# Patient Record
Sex: Female | Born: 1975 | Race: White | Hispanic: No | Smoking: Never smoker
Health system: Southern US, Community
[De-identification: ages and names within clinical notes are randomized; demographics above are authoritative.]

## PROBLEM LIST (undated history)

## (undated) DIAGNOSIS — I10 Essential (primary) hypertension: Secondary | ICD-10-CM

## (undated) DIAGNOSIS — Z9889 Other specified postprocedural states: Secondary | ICD-10-CM

## (undated) DIAGNOSIS — T8859XA Other complications of anesthesia, initial encounter: Secondary | ICD-10-CM

## (undated) DIAGNOSIS — D649 Anemia, unspecified: Secondary | ICD-10-CM

## (undated) DIAGNOSIS — R112 Nausea with vomiting, unspecified: Secondary | ICD-10-CM

## (undated) DIAGNOSIS — T4145XA Adverse effect of unspecified anesthetic, initial encounter: Secondary | ICD-10-CM

## (undated) HISTORY — PX: LUMBAR FUSION: SHX111

---

## 2011-02-11 ENCOUNTER — Encounter (HOSPITAL_COMMUNITY)
Admission: RE | Admit: 2011-02-11 | Discharge: 2011-02-11 | Disposition: A | Discharge status: Home / Self Care | Payer: Medicaid Other | Source: Ambulatory Visit | Attending: Neurosurgery | Admitting: Neurosurgery

## 2011-02-11 ENCOUNTER — Other Ambulatory Visit: Payer: Self-pay | Admitting: Neurosurgery

## 2011-02-11 ENCOUNTER — Ambulatory Visit (HOSPITAL_COMMUNITY)
Admission: RE | Admit: 2011-02-11 | Discharge: 2011-02-11 | Disposition: A | Discharge status: Home / Self Care | Payer: Medicaid Other | Source: Ambulatory Visit | Attending: Neurosurgery | Admitting: Neurosurgery

## 2011-02-11 DIAGNOSIS — Z01810 Encounter for preprocedural cardiovascular examination: Secondary | ICD-10-CM | POA: Insufficient documentation

## 2011-02-11 DIAGNOSIS — M545 Low back pain: Secondary | ICD-10-CM

## 2011-02-11 DIAGNOSIS — Z01812 Encounter for preprocedural laboratory examination: Secondary | ICD-10-CM | POA: Insufficient documentation

## 2011-02-11 DIAGNOSIS — Z01818 Encounter for other preprocedural examination: Secondary | ICD-10-CM | POA: Insufficient documentation

## 2011-02-11 LAB — HCG, SERUM, QUALITATIVE: Preg, Serum: NEGATIVE

## 2011-02-11 LAB — BASIC METABOLIC PANEL
BUN: 6 mg/dL (ref 6–23)
Calcium: 8.9 mg/dL (ref 8.4–10.5)
GFR calc non Af Amer: >60 mL/min (ref >60)
Glucose, Bld: 88 mg/dL (ref 70–99)
Sodium: 138 mEq/L (ref 135–145)

## 2011-02-11 LAB — CBC
MCV: 82.3 fL (ref 78.0–100.0)
Platelets: 234 10*3/uL (ref 150–400)
RBC: 5.04 MIL/uL (ref 3.87–5.11)
RDW: 13.5 % (ref 11.5–15.5)
WBC: 6.5 10*3/uL (ref 4.0–10.5)

## 2011-02-11 LAB — DIFFERENTIAL
Basophils Relative: 1 % (ref 0–1)
Eosinophils Absolute: 0.1 10*3/uL (ref 0.0–0.7)
Eosinophils Relative: 1 % (ref 0–5)
Lymphs Abs: 2.4 10*3/uL (ref 0.7–4.0)
Neutrophils Relative %: 55 % (ref 43–77)

## 2011-02-11 LAB — SURGICAL PCR SCREEN: Staphylococcus aureus: NEGATIVE

## 2011-02-11 LAB — TYPE AND SCREEN: ABO/RH(D): A POS

## 2011-02-15 ENCOUNTER — Inpatient Hospital Stay (HOSPITAL_COMMUNITY): Payer: Medicaid Other

## 2011-02-15 ENCOUNTER — Inpatient Hospital Stay (HOSPITAL_COMMUNITY)
Admission: RE | Admit: 2011-02-15 | Discharge: 2011-02-20 | DRG: 455 | Disposition: A | Discharge status: Home Health | Payer: Medicaid Other | Source: Ambulatory Visit | Attending: Neurosurgery | Admitting: Neurosurgery

## 2011-02-15 DIAGNOSIS — M5126 Other intervertebral disc displacement, lumbar region: Principal | ICD-10-CM | POA: Diagnosis present

## 2011-02-15 DIAGNOSIS — I10 Essential (primary) hypertension: Secondary | ICD-10-CM | POA: Diagnosis present

## 2011-02-20 NOTE — Op Note (Signed)
[name], Branch NO.:  [id number]  MEDICAL RECORD NO.:  [id number]           PATIENT TYPE:  I  LOCATION:  3001                         FACILITY:  [facility]  PHYSICIAN:  Jill Maser. Edder Bellanca, M.D.    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  PROCEDURE NOTE:  Right anterolateral retroperitoneal interbody diskectomy at L4-5 with L4-5 interbody fusion utilizing cage instrumentation.  Bone graft substitute.  A right-sided L4-5 decompressive laminotomy and foraminotomy.  Posterior L4-5 fusion with local autograft and bone graft extender.  L4-5 interspinous plating.  SURGEON:  Jill Maser. Bartow Zylstra, MDASSISTANT:  Reinaldo Meeker, MD  ANESTHESIA:  General endotracheal.  INDICATIONS:  Jill Branch is a 35 year old female with history of severe back and lower extremity pain, failing all conservative management. Workup demonstrates evidence of disk space collapse with disk herniation and facet arthropathy at L5 causing circumferential stenosis more similar on the right side.  The patient has been counseled as to her options.  She decided to proceed with L4-5 decompression and fusion after failing all conservative management.  OPERATIVE NOTE:  The patient was placed in an operative room table in a supine position.  After adequate level of anesthesia achieved, the patient was positioned in the left lateral decubitus position and appropriately padded.  The patient's right flank was prepped and draped sterilely.  Using intraoperative fluoroscopy and intraoperative nerve monitoring, incision was made overlying the right-sided L4-5 interspace. A  secondary incision was made into the right posterior flank.  Through the right posterior flank incision, tissues were split and the retroperitoneal space was entered.  A guide was then passed through the abdominal wall into the right-sided retroperitoneal space and passed down to the level of the psoas muscle.  Stimulation was then performed confirming no lingering effects of muscle relaxation and good intraoperative EMG nerve monitoring.  The guide was then passed toward the L4-5 disk space.  An appropriate docking point was made.  The guide was then distracted and a self-retaining retractor was placed over the guide after a guidewire had been inserted into the L4-5 disk space.  The retractor was confirmed in good position with intraoperative nerve monitoring and fluoroscopy.  Diskectomy was then performed at L4-5 using a various curettes and pituitary rongeurs.  Disk space was eventually sized to a 18 x 8-mm standard implant.  Implant was then packed with Actifuse and OsteoSet and then impacted into place.  Good position in both the lateral and AP plane was confirmed.  The applier was removed. Hemostasis was ensured as the retractor was withdrawn.  There is no evidence of nerve or vascular injury during this part of procedure. Both wounds were then irrigated and closed in typical fashion.  The patient was then turned prone, and appropriately padded.  The patient's lumbar region was prepped and draped sterilely.  A #10-blade was used to make a linear skin incision overlying the L4-5 interspace.  This was carried down sharply in the midline.  A subperiosteal dissection was then performed exposing the lamina and facet joints of L4 and L5.  Deep self-retaining was placed.  Intraoperative x-ray was taken, level was confirmed.  The L4-5 interspace on the right side was then approached and a decompressive laminotomy was then performed using high-speed drill and Kerrison rongeurs.  Inferior  aspect of the lamina of L4 and medial aspect of the L4-5 facet joint.  Superior rim of the L5 lamina was removed.  Ligamentum flavum was elevated and resected in a piecemeal fashion using Kerrison rongeurs.  The underlying thecal sac and exiting L4 and L5 nerve roots were identified and decompressive foraminotomies were performed along the course of the exiting nerve roots.  Disk space was visualized and found to be free of any obvious herniation.  There is no evidence of any annulotomy would be benefit.  Wound was then irrigated with antibiotic solution.  Spinous processes were then skeletonized at L4-5.  A 45-mm NuVasive Smith interspinous plate was then put into position and closed with appropriate force.  The facet and lamina at L4 and L5 on the left side was then decorticated using speed drill.  Morselized autograft mixed with Actifuse was then placed posteriorly for onlay fusion.  Wound was then irrigated one final time, then closed in typical fashion.  Steri-Strips and sterile dressings were applied.  There were no complications.  The patient tolerated the procedure well and she returned to the recovery room postoperatively.          ______________________________ Jill Branch, M.D.     HAP/MEDQ  D:  02/15/2011  T:  02/16/2011  Job:  [id number]  Electronically Signed by Julio Sicks M.D. on 02/20/2011 04:06:40 PM

## 2011-03-21 ENCOUNTER — Other Ambulatory Visit: Payer: Self-pay | Admitting: Neurosurgery

## 2011-03-21 ENCOUNTER — Ambulatory Visit
Admission: RE | Admit: 2011-03-21 | Discharge: 2011-03-21 | Disposition: A | Discharge status: Home / Self Care | Payer: Medicaid Other | Source: Ambulatory Visit | Attending: Neurosurgery | Admitting: Neurosurgery

## 2011-03-21 DIAGNOSIS — M545 Low back pain: Secondary | ICD-10-CM

## 2011-04-11 NOTE — Discharge Summary (Signed)
  NAMEBRETTANY, Jill Branch NO.:  [id number]  MEDICAL RECORD NO.:  [id number]           PATIENT TYPE:  I  LOCATION:  3001                         FACILITY:  [facility]  PHYSICIAN:  Kathaleen Maser. Braddock Servellon, M.D.    DATE OF BIRTH:  01/15/1976  DATE OF ADMISSION:  02/15/2011 DATE OF DISCHARGE:  02/20/2011                              DISCHARGE SUMMARY   SERVICE:  Neurosurgery.  FINAL DIAGNOSIS:  L4-5 stenosis.  HISTORY OF PRESENT ILLNESS:  [name] is a female patient who presents with intractable back and right lower extremity pain.  Workup demonstrates a spondylosis with stenosis at the L4-5 level.  The patient presents now for decompression and fusion in hopes for improving her symptoms.  HOSPITAL COURSE:  The patient underwent uncomplicated L4-5 XLIF followed by right-sided L4-5 decompressive laminotomy and interspinous plating was performed.  Postop, the patient did very well.  She awakened with intact neurological function.  She had quite a bit of incisional soreness and was somewhat slow to mobilize.  She eventually was mobilized to the point where she would be safely discharged home.  At the time of discharge, her wound is healing well.  Her strength and sensation are intact.  Her overall pain level was improved.  CONDITION AT DISCHARGE:  Improved.  DISCHARGE DISPOSITION:  This patient will be discharged home and follow up in my office in 1 week.          ______________________________ Kathaleen Maser. Tyhir Schwan, M.D.     HAP/MEDQ  D:  03/06/2011  T:  03/07/2011  Job:  [id number]  Electronically Signed by Julio Sicks M.D. on 04/11/2011 11:13:20 AM

## 2011-05-24 ENCOUNTER — Other Ambulatory Visit: Payer: Self-pay | Admitting: Neurosurgery

## 2011-05-24 ENCOUNTER — Ambulatory Visit
Admission: RE | Admit: 2011-05-24 | Discharge: 2011-05-24 | Disposition: A | Discharge status: Home / Self Care | Payer: Medicaid Other | Source: Ambulatory Visit | Attending: Neurosurgery | Admitting: Neurosurgery

## 2011-05-24 DIAGNOSIS — M549 Dorsalgia, unspecified: Secondary | ICD-10-CM

## 2018-08-19 ENCOUNTER — Other Ambulatory Visit: Payer: Self-pay | Admitting: Neurosurgery

## 2018-09-03 NOTE — Pre-Procedure Instructions (Signed)
Jill Branch  09/03/2018      [facility] - [place] - [place], [place] - [address] [place] [address] Phone: [phone] Fax: [phone]    Your procedure is scheduled on Monday October 21.  Report to [facility] Admitting at 6:15 A.M.  Call this number if you have problems the morning of surgery:  [phone]   Remember:  Do not eat or drink after midnight.    Take these medicines the morning of surgery with A SIP OF WATER:   Amlodipine (norvasc) Acetaminophen (tylenol) if needed Flonase if needed  7 days prior to surgery STOP taking any Aspirin(unless otherwise instructed by your surgeon), Aleve, Naproxen, Ibuprofen, Motrin, Advil, Goody's, BC's, all herbal medications, fish oil, and all vitamins     Do not wear jewelry, make-up or nail polish.  Do not wear lotions, powders, or perfumes, or deodorant.  Do not shave 48 hours prior to surgery.  Men may shave face and neck.  Do not bring valuables to the hospital.  [facility] is not responsible for any belongings or valuables.  Contacts, dentures or bridgework may not be worn into surgery.  Leave your suitcase in the car.  After surgery it may be brought to your room.  For patients admitted to the hospital, discharge time will be determined by your treatment team.  Patients discharged the day of surgery will not be allowed to drive home.   Special instructions:    [place]- Preparing For Surgery  Before surgery, you can play an important role. Because skin is not sterile, your skin needs to be as free of germs as possible. You can reduce the number of germs on your skin by washing with CHG (chlorahexidine gluconate) Soap before surgery.  CHG is an antiseptic cleaner which kills germs and bonds with the skin to continue killing germs even after washing.    Oral Hygiene is also important to reduce your risk of infection.  Remember - BRUSH YOUR TEETH THE MORNING  OF SURGERY WITH YOUR REGULAR TOOTHPASTE  Please do not use if you have an allergy to CHG or antibacterial soaps. If your skin becomes reddened/irritated stop using the CHG.  Do not shave (including legs and underarms) for at least 48 hours prior to first CHG shower. It is OK to shave your face.  Please follow these instructions carefully.   1. Shower the NIGHT BEFORE SURGERY and the MORNING OF SURGERY with CHG.   2. If you chose to wash your hair, wash your hair first as usual with your normal shampoo.  3. After you shampoo, rinse your hair and body thoroughly to remove the shampoo.  4. Use CHG as you would any other liquid soap. You can apply CHG directly to the skin and wash gently with a scrungie or a clean washcloth.   5. Apply the CHG Soap to your body ONLY FROM THE NECK DOWN.  Do not use on open wounds or open sores. Avoid contact with your eyes, ears, mouth and genitals (private parts). Wash Face and genitals (private parts)  with your normal soap.  6. Wash thoroughly, paying special attention to the area where your surgery will be performed.  7. Thoroughly rinse your body with warm water from the neck down.  8. DO NOT shower/wash with your normal soap after using and rinsing off the CHG Soap.  9. Pat yourself dry with a CLEAN TOWEL.  10. Wear CLEAN PAJAMAS to bed the night before surgery, wear comfortable clothes the morning of surgery  11. Place CLEAN SHEETS on your bed the night of your first shower and DO NOT SLEEP WITH PETS.    Day of Surgery:  Do not apply any deodorants/lotions.  Please wear clean clothes to the hospital/surgery center.   Remember to brush your teeth WITH YOUR REGULAR TOOTHPASTE.    Please read over the following fact sheets that you were given. Coughing and Deep Breathing, MRSA Information and Surgical Site Infection Prevention

## 2018-09-04 ENCOUNTER — Encounter (HOSPITAL_COMMUNITY): Payer: Self-pay

## 2018-09-04 ENCOUNTER — Other Ambulatory Visit: Payer: Self-pay

## 2018-09-04 ENCOUNTER — Encounter (HOSPITAL_COMMUNITY)
Admission: RE | Admit: 2018-09-04 | Discharge: 2018-09-04 | Disposition: A | Discharge status: Home / Self Care | Payer: Medicaid Other | Source: Ambulatory Visit | Attending: Neurosurgery | Admitting: Neurosurgery

## 2018-09-04 DIAGNOSIS — I10 Essential (primary) hypertension: Secondary | ICD-10-CM | POA: Insufficient documentation

## 2018-09-04 DIAGNOSIS — Z01818 Encounter for other preprocedural examination: Secondary | ICD-10-CM | POA: Insufficient documentation

## 2018-09-04 HISTORY — DX: Anemia, unspecified: D64.9

## 2018-09-04 HISTORY — DX: Nausea with vomiting, unspecified: R11.2

## 2018-09-04 HISTORY — DX: Other specified postprocedural states: Z98.890

## 2018-09-04 HISTORY — DX: Other complications of anesthesia, initial encounter: T88.59XA

## 2018-09-04 HISTORY — DX: Essential (primary) hypertension: I10

## 2018-09-04 HISTORY — DX: Adverse effect of unspecified anesthetic, initial encounter: T41.45XA

## 2018-09-04 LAB — CBC
HEMATOCRIT: 46.5 % — AB (ref 36.0–46.0)
HEMOGLOBIN: 14.9 g/dL (ref 12.0–15.0)
MCH: 28 pg (ref 26.0–34.0)
MCHC: 32 g/dL (ref 30.0–36.0)
MCV: 87.2 fL (ref 80.0–100.0)
Platelets: 277 10*3/uL (ref 150–400)
RBC: 5.33 MIL/uL — ABNORMAL HIGH (ref 3.87–5.11)
RDW: 13 % (ref 11.5–15.5)
WBC: 7.2 10*3/uL (ref 4.0–10.5)
nRBC: 0 % (ref 0.0–0.2)

## 2018-09-04 LAB — BASIC METABOLIC PANEL
Anion gap: 8 (ref 5–15)
BUN: 5 mg/dL — AB (ref 6–20)
CALCIUM: 9.4 mg/dL (ref 8.9–10.3)
CHLORIDE: 109 mmol/L (ref 98–111)
CO2: 24 mmol/L (ref 22–32)
CREATININE: 0.99 mg/dL (ref 0.44–1.00)
GFR calc non Af Amer: >60 mL/min (ref >60)
Glucose, Bld: 114 mg/dL — ABNORMAL HIGH (ref 70–99)
Potassium: 3.3 mmol/L — ABNORMAL LOW (ref 3.5–5.1)
Sodium: 141 mmol/L (ref 135–145)

## 2018-09-04 LAB — SURGICAL PCR SCREEN
MRSA, PCR: NEGATIVE
Staphylococcus aureus: NEGATIVE

## 2018-09-04 NOTE — Progress Notes (Signed)
PCP - Tacey Ruiz, FNP Cardiologist - denies any cardiac studies  Chest x-ray - N/A  EKG - 09/04/18  Sleep Study - denies  Aspirin Instructions: N/A  Anesthesia review: No  Patient denies shortness of breath, fever, cough and chest pain at PAT appointment   Patient verbalized understanding of instructions that were given to them at the PAT appointment. Patient was also instructed that they will need to review over the PAT instructions again at home before surgery.

## 2018-09-14 ENCOUNTER — Ambulatory Visit (HOSPITAL_COMMUNITY): Payer: Medicaid Other

## 2018-09-14 ENCOUNTER — Other Ambulatory Visit: Payer: Self-pay

## 2018-09-14 ENCOUNTER — Encounter (HOSPITAL_COMMUNITY)
Admission: RE | Disposition: A | Discharge status: Home / Self Care | Payer: Self-pay | Source: Ambulatory Visit | Attending: Neurosurgery

## 2018-09-14 ENCOUNTER — Observation Stay (HOSPITAL_COMMUNITY)
Admission: RE | Admit: 2018-09-14 | Discharge: 2018-09-14 | Disposition: A | Discharge status: Home / Self Care | Payer: Medicaid Other | Source: Ambulatory Visit | Attending: Neurosurgery | Admitting: Neurosurgery

## 2018-09-14 ENCOUNTER — Encounter (HOSPITAL_COMMUNITY): Payer: Self-pay | Admitting: *Deleted

## 2018-09-14 ENCOUNTER — Ambulatory Visit (HOSPITAL_COMMUNITY): Payer: Medicaid Other | Admitting: Emergency Medicine

## 2018-09-14 DIAGNOSIS — Z888 Allergy status to other drugs, medicaments and biological substances status: Secondary | ICD-10-CM | POA: Diagnosis not present

## 2018-09-14 DIAGNOSIS — Z419 Encounter for procedure for purposes other than remedying health state, unspecified: Secondary | ICD-10-CM

## 2018-09-14 DIAGNOSIS — M5117 Intervertebral disc disorders with radiculopathy, lumbosacral region: Secondary | ICD-10-CM | POA: Diagnosis not present

## 2018-09-14 DIAGNOSIS — I10 Essential (primary) hypertension: Secondary | ICD-10-CM | POA: Insufficient documentation

## 2018-09-14 DIAGNOSIS — Z79899 Other long term (current) drug therapy: Secondary | ICD-10-CM | POA: Insufficient documentation

## 2018-09-14 DIAGNOSIS — M5416 Radiculopathy, lumbar region: Secondary | ICD-10-CM | POA: Diagnosis present

## 2018-09-14 HISTORY — PX: LUMBAR LAMINECTOMY/DECOMPRESSION MICRODISCECTOMY: SHX5026

## 2018-09-14 LAB — POCT PREGNANCY, URINE: Preg Test, Ur: NEGATIVE

## 2018-09-14 SURGERY — LUMBAR LAMINECTOMY/DECOMPRESSION MICRODISCECTOMY 1 LEVEL
Anesthesia: General | Site: Back | Laterality: Left

## 2018-09-14 MED ORDER — BUPIVACAINE HCL (PF) 0.25 % IJ SOLN
INTRAMUSCULAR | Status: AC
  Filled 2018-09-14: qty 30

## 2018-09-14 MED ORDER — HYDROCODONE-ACETAMINOPHEN 10-325 MG PO TABS
ORAL_TABLET | ORAL | Status: AC
  Filled 2018-09-14: qty 1

## 2018-09-14 MED ORDER — ACETAMINOPHEN 325 MG PO TABS: 650.0000 mg | ORAL_TABLET | ORAL | Status: DC | PRN

## 2018-09-14 MED ORDER — HYDROMORPHONE HCL 1 MG/ML IJ SOLN: 1.0000 mg | INTRAMUSCULAR | Status: DC | PRN

## 2018-09-14 MED ORDER — CHLORHEXIDINE GLUCONATE CLOTH 2 % EX PADS: 6.0000 | MEDICATED_PAD | Freq: Once | CUTANEOUS | Status: DC

## 2018-09-14 MED ORDER — MIDAZOLAM HCL 5 MG/5ML IJ SOLN
INTRAMUSCULAR | Status: DC | PRN
  Administered 2018-09-14: 2 mg via INTRAVENOUS

## 2018-09-14 MED ORDER — HYDROCODONE-ACETAMINOPHEN 10-325 MG PO TABS
2.0000 | ORAL_TABLET | ORAL | Status: DC | PRN
  Administered 2018-09-14 (×2): 2 via ORAL
  Filled 2018-09-14: qty 2

## 2018-09-14 MED ORDER — FLUTICASONE PROPIONATE 50 MCG/ACT NA SUSP: 2.0000 | Freq: Every day | NASAL | Status: DC | PRN

## 2018-09-14 MED ORDER — ONDANSETRON HCL 4 MG/2ML IJ SOLN
INTRAMUSCULAR | Status: DC | PRN
  Administered 2018-09-14: 4 mg via INTRAVENOUS

## 2018-09-14 MED ORDER — ONDANSETRON HCL 4 MG/2ML IJ SOLN: 4.0000 mg | Freq: Four times a day (QID) | INTRAMUSCULAR | Status: DC | PRN

## 2018-09-14 MED ORDER — ROCURONIUM BROMIDE 50 MG/5ML IV SOSY
PREFILLED_SYRINGE | INTRAVENOUS | Status: DC | PRN
  Administered 2018-09-14 (×2): 50 mg via INTRAVENOUS

## 2018-09-14 MED ORDER — DEXAMETHASONE SODIUM PHOSPHATE 10 MG/ML IJ SOLN
INTRAMUSCULAR | Status: DC | PRN
  Administered 2018-09-14: 10 mg via INTRAVENOUS

## 2018-09-14 MED ORDER — PHENOL 1.4 % MT LIQD: 1.0000 | OROMUCOSAL | Status: DC | PRN

## 2018-09-14 MED ORDER — OXYCODONE HCL 5 MG/5ML PO SOLN: 5.0000 mg | Freq: Once | ORAL | Status: DC | PRN

## 2018-09-14 MED ORDER — CEFAZOLIN SODIUM-DEXTROSE 2-4 GM/100ML-% IV SOLN
2.0000 g | INTRAVENOUS | Status: AC
  Administered 2018-09-14: 2 g via INTRAVENOUS

## 2018-09-14 MED ORDER — BUPIVACAINE HCL (PF) 0.25 % IJ SOLN
INTRAMUSCULAR | Status: DC | PRN
  Administered 2018-09-14: 20 mL

## 2018-09-14 MED ORDER — THROMBIN 5000 UNITS EX SOLR
CUTANEOUS | Status: DC | PRN
  Administered 2018-09-14 (×2): 5000 [IU] via TOPICAL

## 2018-09-14 MED ORDER — 0.9 % SODIUM CHLORIDE (POUR BTL) OPTIME
TOPICAL | Status: DC | PRN
  Administered 2018-09-14: 1000 mL

## 2018-09-14 MED ORDER — PROPOFOL 10 MG/ML IV BOLUS
INTRAVENOUS | Status: DC | PRN
  Administered 2018-09-14: 160 mg via INTRAVENOUS

## 2018-09-14 MED ORDER — SODIUM CHLORIDE 0.9% FLUSH: 3.0000 mL | Freq: Two times a day (BID) | INTRAVENOUS | Status: DC

## 2018-09-14 MED ORDER — SCOPOLAMINE 1 MG/3DAYS TD PT72
1.0000 | MEDICATED_PATCH | TRANSDERMAL | Status: DC
  Administered 2018-09-14: 1.5 mg via TRANSDERMAL

## 2018-09-14 MED ORDER — DEXAMETHASONE SODIUM PHOSPHATE 10 MG/ML IJ SOLN
INTRAMUSCULAR | Status: AC
  Filled 2018-09-14: qty 1

## 2018-09-14 MED ORDER — SUGAMMADEX SODIUM 200 MG/2ML IV SOLN
INTRAVENOUS | Status: DC | PRN
  Administered 2018-09-14: 200 mg via INTRAVENOUS

## 2018-09-14 MED ORDER — KETOROLAC TROMETHAMINE 15 MG/ML IJ SOLN
30.0000 mg | Freq: Four times a day (QID) | INTRAMUSCULAR | Status: DC
  Administered 2018-09-14: 30 mg via INTRAVENOUS
  Filled 2018-09-14: qty 2

## 2018-09-14 MED ORDER — TRAMADOL HCL 50 MG PO TABS: 50.0000 mg | ORAL_TABLET | ORAL | 0 refills | Status: AC | PRN

## 2018-09-14 MED ORDER — MENTHOL 3 MG MT LOZG: 1.0000 | LOZENGE | OROMUCOSAL | Status: DC | PRN

## 2018-09-14 MED ORDER — CYCLOBENZAPRINE HCL 10 MG PO TABS
10.0000 mg | ORAL_TABLET | Freq: Three times a day (TID) | ORAL | Status: DC | PRN
  Administered 2018-09-14: 10 mg via ORAL
  Filled 2018-09-14: qty 1

## 2018-09-14 MED ORDER — KETOROLAC TROMETHAMINE 30 MG/ML IJ SOLN
INTRAMUSCULAR | Status: DC | PRN
  Administered 2018-09-14: 30 mg via INTRAVENOUS

## 2018-09-14 MED ORDER — SODIUM CHLORIDE 0.9 % IV SOLN
INTRAVENOUS | Status: DC | PRN
  Administered 2018-09-14: 09:00:00

## 2018-09-14 MED ORDER — PHENYLEPHRINE HCL 10 MG/ML IJ SOLN
INTRAMUSCULAR | Status: DC | PRN
  Administered 2018-09-14: 40 ug via INTRAVENOUS

## 2018-09-14 MED ORDER — FENTANYL CITRATE (PF) 250 MCG/5ML IJ SOLN
INTRAMUSCULAR | Status: AC
  Filled 2018-09-14: qty 5

## 2018-09-14 MED ORDER — OXYCODONE HCL 5 MG PO TABS: 5.0000 mg | ORAL_TABLET | Freq: Once | ORAL | Status: DC | PRN

## 2018-09-14 MED ORDER — ONDANSETRON HCL 4 MG PO TABS: 4.0000 mg | ORAL_TABLET | Freq: Four times a day (QID) | ORAL | Status: DC | PRN

## 2018-09-14 MED ORDER — SODIUM CHLORIDE 0.9 % IV SOLN: 250.0000 mL | INTRAVENOUS | Status: DC

## 2018-09-14 MED ORDER — THROMBIN 5000 UNITS EX SOLR
CUTANEOUS | Status: AC
  Filled 2018-09-14: qty 15000

## 2018-09-14 MED ORDER — FENTANYL CITRATE (PF) 100 MCG/2ML IJ SOLN
INTRAMUSCULAR | Status: AC
  Filled 2018-09-14: qty 2

## 2018-09-14 MED ORDER — LIDOCAINE 2% (20 MG/ML) 5 ML SYRINGE
INTRAMUSCULAR | Status: DC | PRN
  Administered 2018-09-14: 60 mg via INTRAVENOUS

## 2018-09-14 MED ORDER — FENTANYL CITRATE (PF) 100 MCG/2ML IJ SOLN
25.0000 ug | INTRAMUSCULAR | Status: DC | PRN
  Administered 2018-09-14: 50 ug via INTRAVENOUS

## 2018-09-14 MED ORDER — CEFAZOLIN SODIUM-DEXTROSE 1-4 GM/50ML-% IV SOLN
1.0000 g | Freq: Three times a day (TID) | INTRAVENOUS | Status: DC
  Administered 2018-09-14: 1 g via INTRAVENOUS
  Filled 2018-09-14: qty 50

## 2018-09-14 MED ORDER — ACETAMINOPHEN 650 MG RE SUPP: 650.0000 mg | RECTAL | Status: DC | PRN

## 2018-09-14 MED ORDER — HYDROCODONE-ACETAMINOPHEN 5-325 MG PO TABS: 1.0000 | ORAL_TABLET | ORAL | Status: DC | PRN

## 2018-09-14 MED ORDER — SODIUM CHLORIDE 0.9% FLUSH: 3.0000 mL | INTRAVENOUS | Status: DC | PRN

## 2018-09-14 MED ORDER — LIDOCAINE 2% (20 MG/ML) 5 ML SYRINGE
INTRAMUSCULAR | Status: AC
  Filled 2018-09-14: qty 5

## 2018-09-14 MED ORDER — LACTATED RINGERS IV SOLN
INTRAVENOUS | Status: DC | PRN
  Administered 2018-09-14: 08:00:00 via INTRAVENOUS

## 2018-09-14 MED ORDER — AMLODIPINE BESYLATE 5 MG PO TABS: 10.0000 mg | ORAL_TABLET | Freq: Every day | ORAL | Status: DC

## 2018-09-14 MED ORDER — ONDANSETRON HCL 4 MG/2ML IJ SOLN
INTRAMUSCULAR | Status: AC
  Filled 2018-09-14: qty 2

## 2018-09-14 MED ORDER — CYCLOBENZAPRINE HCL 10 MG PO TABS: 10.0000 mg | ORAL_TABLET | Freq: Three times a day (TID) | ORAL | 0 refills | Status: AC | PRN

## 2018-09-14 MED ORDER — CEFAZOLIN SODIUM-DEXTROSE 2-4 GM/100ML-% IV SOLN
INTRAVENOUS | Status: AC
  Filled 2018-09-14: qty 100

## 2018-09-14 MED ORDER — MIDAZOLAM HCL 2 MG/2ML IJ SOLN
INTRAMUSCULAR | Status: AC
  Filled 2018-09-14: qty 2

## 2018-09-14 MED ORDER — PROPOFOL 10 MG/ML IV BOLUS
INTRAVENOUS | Status: AC
  Filled 2018-09-14: qty 20

## 2018-09-14 MED ORDER — HEMOSTATIC AGENTS (NO CHARGE) OPTIME
TOPICAL | Status: DC | PRN
  Administered 2018-09-14: 1 via TOPICAL

## 2018-09-14 MED ORDER — FENTANYL CITRATE (PF) 100 MCG/2ML IJ SOLN
INTRAMUSCULAR | Status: DC | PRN
  Administered 2018-09-14: 50 ug via INTRAVENOUS
  Administered 2018-09-14 (×2): 100 ug via INTRAVENOUS

## 2018-09-14 MED ORDER — ROCURONIUM BROMIDE 50 MG/5ML IV SOSY
PREFILLED_SYRINGE | INTRAVENOUS | Status: AC
  Filled 2018-09-14: qty 5

## 2018-09-14 SURGICAL SUPPLY — 58 items
BAG DECANTER FOR FLEXI CONT (MISCELLANEOUS) ×3 IMPLANT
BENZOIN TINCTURE PRP APPL 2/3 (GAUZE/BANDAGES/DRESSINGS) ×3 IMPLANT
BLADE CLIPPER SURG (BLADE) IMPLANT
BUR CUTTER 7.0 ROUND (BURR) ×3 IMPLANT
CANISTER SUCT 3000ML PPV (MISCELLANEOUS) ×3 IMPLANT
CARTRIDGE OIL MAESTRO DRILL (MISCELLANEOUS) ×1 IMPLANT
CLOSURE WOUND 1/2 X4 (GAUZE/BANDAGES/DRESSINGS) ×1
COVER WAND RF STERILE (DRAPES) ×3 IMPLANT
DECANTER SPIKE VIAL GLASS SM (MISCELLANEOUS) ×3 IMPLANT
DERMABOND ADVANCED (GAUZE/BANDAGES/DRESSINGS) ×2
DERMABOND ADVANCED .7 DNX12 (GAUZE/BANDAGES/DRESSINGS) ×1 IMPLANT
DIFFUSER DRILL AIR PNEUMATIC (MISCELLANEOUS) ×3 IMPLANT
DRAPE HALF SHEET 40X57 (DRAPES) IMPLANT
DRAPE LAPAROTOMY 100X72X124 (DRAPES) ×3 IMPLANT
DRAPE MICROSCOPE LEICA (MISCELLANEOUS) ×3 IMPLANT
DRAPE SURG 17X23 STRL (DRAPES) ×6 IMPLANT
DRSG OPSITE POSTOP 4X6 (GAUZE/BANDAGES/DRESSINGS) ×3 IMPLANT
DURAPREP 26ML APPLICATOR (WOUND CARE) ×3 IMPLANT
ELECT REM PT RETURN 9FT ADLT (ELECTROSURGICAL) ×3
ELECTRODE REM PT RTRN 9FT ADLT (ELECTROSURGICAL) ×1 IMPLANT
GAUZE 4X4 16PLY RFD (DISPOSABLE) IMPLANT
GAUZE SPONGE 4X4 12PLY STRL (GAUZE/BANDAGES/DRESSINGS) IMPLANT
GLOVE BIO SURGEON STRL SZ 6.5 (GLOVE) ×2 IMPLANT
GLOVE BIO SURGEON STRL SZ8 (GLOVE) ×3 IMPLANT
GLOVE BIO SURGEONS STRL SZ 6.5 (GLOVE) ×1
GLOVE BIOGEL PI IND STRL 6.5 (GLOVE) ×1 IMPLANT
GLOVE BIOGEL PI IND STRL 7.5 (GLOVE) ×1 IMPLANT
GLOVE BIOGEL PI IND STRL 8 (GLOVE) ×4 IMPLANT
GLOVE BIOGEL PI INDICATOR 6.5 (GLOVE) ×2
GLOVE BIOGEL PI INDICATOR 7.5 (GLOVE) ×2
GLOVE BIOGEL PI INDICATOR 8 (GLOVE) ×8
GLOVE ECLIPSE 7.5 STRL STRAW (GLOVE) ×9 IMPLANT
GLOVE ECLIPSE 9.0 STRL (GLOVE) ×3 IMPLANT
GLOVE EXAM NITRILE LRG STRL (GLOVE) IMPLANT
GLOVE EXAM NITRILE XL STR (GLOVE) IMPLANT
GLOVE EXAM NITRILE XS STR PU (GLOVE) IMPLANT
GLOVE INDICATOR 8.5 STRL (GLOVE) ×3 IMPLANT
GOWN STRL REUS W/ TWL LRG LVL3 (GOWN DISPOSABLE) ×1 IMPLANT
GOWN STRL REUS W/ TWL XL LVL3 (GOWN DISPOSABLE) ×2 IMPLANT
GOWN STRL REUS W/TWL 2XL LVL3 (GOWN DISPOSABLE) ×6 IMPLANT
GOWN STRL REUS W/TWL LRG LVL3 (GOWN DISPOSABLE) ×2
GOWN STRL REUS W/TWL XL LVL3 (GOWN DISPOSABLE) ×4
KIT BASIN OR (CUSTOM PROCEDURE TRAY) ×3 IMPLANT
KIT TURNOVER KIT B (KITS) ×3 IMPLANT
NEEDLE HYPO 22GX1.5 SAFETY (NEEDLE) ×3 IMPLANT
NEEDLE SPNL 22GX3.5 QUINCKE BK (NEEDLE) ×3 IMPLANT
NS IRRIG 1000ML POUR BTL (IV SOLUTION) ×3 IMPLANT
OIL CARTRIDGE MAESTRO DRILL (MISCELLANEOUS) ×3
PACK LAMINECTOMY NEURO (CUSTOM PROCEDURE TRAY) ×3 IMPLANT
PAD ARMBOARD 7.5X6 YLW CONV (MISCELLANEOUS) ×12 IMPLANT
RUBBERBAND STERILE (MISCELLANEOUS) ×6 IMPLANT
SPONGE SURGIFOAM ABS GEL SZ50 (HEMOSTASIS) ×3 IMPLANT
STRIP CLOSURE SKIN 1/2X4 (GAUZE/BANDAGES/DRESSINGS) ×2 IMPLANT
SUT VIC AB 2-0 CT1 18 (SUTURE) ×3 IMPLANT
SUT VIC AB 3-0 SH 8-18 (SUTURE) ×3 IMPLANT
TOWEL GREEN STERILE (TOWEL DISPOSABLE) ×3 IMPLANT
TOWEL GREEN STERILE FF (TOWEL DISPOSABLE) ×3 IMPLANT
WATER STERILE IRR 1000ML POUR (IV SOLUTION) ×3 IMPLANT

## 2018-09-14 NOTE — Anesthesia Postprocedure Evaluation (Signed)
Anesthesia Post Note  Patient: Shams M Ogletree  Procedure(s) Performed: MICRODISCECTOMY LEFT LUMBAR FIVE- SACRAL ONE  (Left Back)     Patient location during evaluation: PACU Anesthesia Type: General Level of consciousness: awake and alert Pain management: pain level controlled Vital Signs Assessment: post-procedure vital signs reviewed and stable Respiratory status: spontaneous breathing, nonlabored ventilation, respiratory function stable and patient connected to nasal cannula oxygen Cardiovascular status: blood pressure returned to baseline and stable Postop Assessment: no apparent nausea or vomiting Anesthetic complications: no    Last Vitals:  Vitals:   09/14/18 1020 09/14/18 1035  BP: 128/82 116/77  Pulse: (!) 102 (!) 105  Resp: 19 18  Temp:  36.5 C  SpO2: 96% 97%    Last Pain:  Vitals:   09/14/18 1035  TempSrc:   PainSc: 4     LLE Motor Response: Purposeful movement;Responds to commands (09/14/18 1035) LLE Sensation: Full sensation (09/14/18 1035) RLE Motor Response: Purposeful movement;Responds to commands (09/14/18 1035) RLE Sensation: Full sensation (09/14/18 1035)      Keilly Fatula S

## 2018-09-14 NOTE — Discharge Summary (Signed)
Physician Discharge Summary  Patient ID: Jill Branch MRN: [id number] DOB/AGE: [birth year] 42 y.o.  Admit date: 09/14/2018 Discharge date: 09/14/2018  Admission Diagnoses:  Discharge Diagnoses:  Active Problems:   Lumbar radiculopathy   Discharged Condition: good  Hospital Course: Patient been to the hospital where she underwent uncomplicated lumbar microdiscectomy.  Postoperative patient is doing very well.  Preoperative back and lower extremity pain much improved.  Standing and walking without difficulty.  Ready for discharge home.  Consults:   Significant Diagnostic Studies:   Treatments:   Discharge Exam: Blood pressure 133/71, pulse (!) 102, temperature 98.2 F (36.8 C), temperature source Oral, resp. rate 18, SpO2 97 %. Awake and alert.  Oriented and appropriate.  Cranial nerve function intact.  Motor and sensory function extremities normal.  Wound clean and dry.  Chest and abdomen benign.  Disposition: Discharge disposition: 01-Home or Self Care        Allergies as of 09/14/2018      Reactions   Gabapentin Swelling   Arm    Adhesive [tape] Swelling, Rash      Medication List    TAKE these medications   acetaminophen 500 MG tablet Commonly known as:  TYLENOL Take 500 mg by mouth every 6 (six) hours as needed for moderate pain.   amLODipine 10 MG tablet Commonly known as:  NORVASC Take 10 mg by mouth daily.   cyclobenzaprine 10 MG tablet Commonly known as:  FLEXERIL Take 1 tablet (10 mg total) by mouth 3 (three) times daily as needed for muscle spasms.   etonogestrel 68 MG Impl implant Commonly known as:  NEXPLANON 1 each by Subdermal route once.   fluticasone 50 MCG/ACT nasal spray Commonly known as:  FLONASE Place 2 sprays into both nostrils daily as needed for allergies or rhinitis.   traMADol 50 MG tablet Commonly known as:  ULTRAM Take 1-2 tablets (50-100 mg total) by mouth every 4 (four) hours as needed.        Signed: Kathaleen Maser  Nazier Neyhart 09/14/2018, 5:51 PM

## 2018-09-14 NOTE — Discharge Instructions (Signed)

## 2018-09-14 NOTE — H&P (Signed)
  Jill Branch is an 42 y.o. female.   Chief Complaint: Back pain HPI: 42 year old female with severe back and left lower extremity pain consistent with a left-sided S1 radiculopathy failing conservative management.  Patient status post prior L4-5 decompression and fusion by another physician.  Patient underwent work-up which demonstrates evidence of a large paracentral disc herniation at L5-S1 with severe compression of the thecal sac and left S1 nerve root.  Patient presents now for laminotomy and microdiscectomy in hopes of improving her symptoms.  Past Medical History:  Diagnosis Date  . Anemia   . Complication of anesthesia   . Hypertension   . PONV (postoperative nausea and vomiting)     Past Surgical History:  Procedure Laterality Date  . LUMBAR FUSION     L4-L5    History reviewed. No pertinent family history. Social History:  reports that she has never smoked. She has never used smokeless tobacco. She reports that she does not drink alcohol or use drugs.  Allergies:  Allergies  Allergen Reactions  . Gabapentin Swelling    Arm   . Adhesive [Tape] Swelling and Rash    Medications Prior to Admission  Medication Sig Dispense Refill  . acetaminophen (TYLENOL) 500 MG tablet Take 500 mg by mouth every 6 (six) hours as needed for moderate pain.    Marland Kitchen amLODipine (NORVASC) 10 MG tablet Take 10 mg by mouth daily.  0  . etonogestrel (NEXPLANON) 68 MG IMPL implant 1 each by Subdermal route once.    . fluticasone (FLONASE) 50 MCG/ACT nasal spray Place 2 sprays into both nostrils daily as needed for allergies or rhinitis.      Results for orders placed or performed during the hospital encounter of 09/14/18 (from the past 48 hour(s))  Pregnancy, urine POC     Status: None   Collection Time: 09/14/18  7:14 AM  Result Value Ref Range   Preg Test, Ur NEGATIVE NEGATIVE    Comment:        THE SENSITIVITY OF THIS METHODOLOGY IS >24 mIU/mL    No results found.  Pertinent items  noted in HPI and remainder of comprehensive ROS otherwise negative.  Blood pressure (!) 153/84, pulse 78, temperature 98.7 F (37.1 C), temperature source Oral, resp. rate 18, SpO2 100 %.  Patient is awake and alert.  She is oriented and appropriate.  Cranial nerve function intact.  Motor and sensory function extremities normal except for some mild weakness of her left-sided gastrocnemius muscle.  Gait antalgic.  Posture flexed..  Straight leg raising positive on the left.  Examination head ears eyes nose throat is unremarkable.  Chest and abdomen benign.  Extremities free from injury or deformity. Assessment/Plan Left L5-S1 herniated nucleus pulposis with radiculopathy.  Plan left L5-S1 laminotomy microdiscectomy.  Risks and benefits of been explained.  Patient wishes to proceed.  Sherilyn Cooter A Kayti Poss 09/14/2018, 7:45 AM

## 2018-09-14 NOTE — Anesthesia Preprocedure Evaluation (Signed)
Anesthesia Evaluation  Patient identified by MRN, date of birth, ID band Patient awake    Reviewed: Allergy & Precautions, H&P , NPO status , Patient's Chart, lab work & pertinent test results  History of Anesthesia Complications (+) PONV and history of anesthetic complications  Airway Mallampati: II   Neck ROM: full    Dental   Pulmonary neg pulmonary ROS,    breath sounds clear to auscultation       Cardiovascular hypertension,  Rhythm:regular Rate:Normal     Neuro/Psych    GI/Hepatic   Endo/Other  obese  Renal/GU      Musculoskeletal   Abdominal   Peds  Hematology   Anesthesia Other Findings   Reproductive/Obstetrics                             Anesthesia Physical Anesthesia Plan  ASA: II  Anesthesia Plan: General   Post-op Pain Management:    Induction: Intravenous  PONV Risk Score and Plan: 4 or greater and Ondansetron, Dexamethasone, Midazolam, Scopolamine patch - Pre-op and Treatment may vary due to age or medical condition  Airway Management Planned: Oral ETT  Additional Equipment:   Intra-op Plan:   Post-operative Plan: Extubation in OR  Informed Consent: I have reviewed the patients History and Physical, chart, labs and discussed the procedure including the risks, benefits and alternatives for the proposed anesthesia with the patient or authorized representative who has indicated his/her understanding and acceptance.     Plan Discussed with: CRNA, Anesthesiologist and Surgeon  Anesthesia Plan Comments:         Anesthesia Quick Evaluation

## 2018-09-14 NOTE — Op Note (Signed)
Date of procedure: 09/14/2018  Date of dictation: Same  Service: Neurosurgery  Preoperative diagnosis: Left L5-S1 herniated nucleus pulposus with radiculopathy  Postoperative diagnosis: Same  Procedure Name: Left L5-S1 laminotomy, microdiscectomy   Surgeon:Finch Costanzo A.Gagandeep Pettet, M.D.  Asst. Surgeon: Doran Durand, NP  Anesthesia: General  Indication: 42 year old female status post prior L45 decompression and fusion surgery presents with back and left lower extremity pain failing conservative management.  Work-up demonstrates evidence of a left-sided L5-S1 disc herniation with marked compression of the thecal sac and left S1 nerve root.  Patient has failed conservative management presents now for laminotomy and microdiscectomy in hopes of improving her symptoms.  Operative note: After induction of anesthesia, patient position prone on the Wilson frame and properly padded.  Lumbar region prepped and draped sterilely.  Incision made overlying L5-S1.  Dissection performed on the left.  Retractor placed.  X-ray taken.  Level confirmed.  Laminotomy then performed using high-speed drill and Kerrison Rogers to remove the inferior aspect lamina of L5 medial aspect the L5-S1 facet joint and the superior rim of the S1 lamina.  Ligament flavum elevated and resected.  Underlying thecal sac and left S1 nerve root identified.  Microscope brought in field is Kathlene November resection of the spinal canal.  Epidural venous plexus was coagulated and cut.  Thecal sac and S1 nerve root gently mobilized and retracted towards midline.  Disc herniation readily apparent.  This then incised and a large fragment was dissected free using blunt nerve hooks and pituitary rongeurs.  Number of other small fragments were removed.  The disc space itself was otherwise flattened at this point.  There was no evidence of gross annular tear.  I do not see any benefit in cutting into the disc space proper.  Wound is then irrigated fanlike solution.  There was  no evidence of injury to the thecal sac or nerve roots.  Wound is then closed in layers with Vicryl sutures.  Steri-Strips and sterile dressing were applied.  No apparent complications.  Patient tolerated the procedure well and she returns to the recovery room postop.

## 2018-09-14 NOTE — Anesthesia Procedure Notes (Signed)
Procedure Name: Intubation Date/Time: 09/14/2018 8:13 AM Performed by: Achille Rich, MD Pre-anesthesia Checklist: Patient identified, Emergency Drugs available, Suction available and Patient being monitored Patient Re-evaluated:Patient Re-evaluated prior to induction Oxygen Delivery Method: Circle system utilized Preoxygenation: Pre-oxygenation with 100% oxygen Induction Type: IV induction Ventilation: Mask ventilation without difficulty Grade View: Grade I Tube type: Oral Airway Equipment and Method: Stylet Placement Confirmation: ETT inserted through vocal cords under direct vision and positive ETCO2 Secured at: 22 cm Tube secured with: Tape Dental Injury: Teeth and Oropharynx as per pre-operative assessment

## 2018-09-14 NOTE — Evaluation (Signed)
Occupational Therapy Evaluation Patient Details Name: Jill Branch MRN: [id number] DOB: 10-16-1976 Today's Date: 09/14/2018    History of Present Illness 42 yo female s/p Left L5-S1 laminotomy, microdiscectomy. PMH including lumbar fusion, HTN, and anemia.    Clinical Impression   PTA, pt was living with her parents and sons and was independent. Currently, pt performing ADLs and functional mobility using cane with supervision. Provided education and handout on back precautions, bed mobility, LB ADLs, toileting, and shower transfer with shower seat; pt demonstrated understanding. Answered all pt questions. Recommend dc home once medically stable per physician. All acute OT needs met and will sign off. Thank you.     Follow Up Recommendations  No OT follow up;Supervision/Assistance - 24 hour    Equipment Recommendations  None recommended by OT    Recommendations for Other Services       Precautions / Restrictions Precautions Precautions: Back Restrictions Weight Bearing Restrictions: No      Mobility Bed Mobility Overal bed mobility: Needs Assistance Bed Mobility: Rolling;Sidelying to Sit Rolling: Supervision Sidelying to sit: Supervision       General bed mobility comments: Providing education on log roll technique. Supervision for safety  Transfers Overall transfer level: Needs assistance Equipment used: None;Straight cane Transfers: Sit to/from Stand Sit to Stand: Supervision         General transfer comment: supervision for initial safety    Balance Overall balance assessment: No apparent balance deficits (not formally assessed)                                         ADL either performed or assessed with clinical judgement   ADL Overall ADL's : Needs assistance/impaired                                       General ADL Comments: Providing education on back preccautions, bed mobility, LB ADLs, toileting, and shower  transfer. Pt performing at supervision level demonstrating understanding.      Vision         Perception     Praxis      Pertinent Vitals/Pain Pain Assessment: Faces Faces Pain Scale: Hurts little more Pain Location: Back Pain Descriptors / Indicators: Constant;Discomfort;Grimacing Pain Intervention(s): Monitored during session;Limited activity within patient's tolerance;Premedicated before session;Repositioned     Hand Dominance Right   Extremity/Trunk Assessment Upper Extremity Assessment Upper Extremity Assessment: Overall WFL for tasks assessed   Lower Extremity Assessment Lower Extremity Assessment: Generalized weakness   Cervical / Trunk Assessment Cervical / Trunk Assessment: Other exceptions Cervical / Trunk Exceptions: s/p Left L5-S1 laminotomy, microdiscectomy   Communication Communication Communication: No difficulties   Cognition Arousal/Alertness: Awake/alert Behavior During Therapy: WFL for tasks assessed/performed Overall Cognitive Status: Within Functional Limits for tasks assessed                                     General Comments  Mother and son, Roderic Palau, present throughout    Exercises     Shoulder Instructions      Home Living Family/patient expects to be discharged to:: Private residence Living Arrangements: Parent;Children(Mother, Father, and two sons (27 and 65 yo)) Available Help at Discharge: Family;Available 24 hours/day Type of Home: Mobile home Home  Access: Stairs to enter [employer] of Steps: 6   Home Layout: One level     Bathroom Shower/Tub: Tub/shower unit;Walk-in Psychologist, prison and probation services: Standard     Home Equipment: Shower seat;Cane - single point          Prior Functioning/Environment Level of Independence: Independent        Comments: ADLs and light IADLs        OT Problem List: Decreased activity tolerance;Decreased knowledge of precautions;Pain;Impaired balance (sitting  and/or standing)      OT Treatment/Interventions:      OT Goals(Current goals can be found in the care plan section) Acute Rehab OT Goals Patient Stated Goal: Go home today OT Goal Formulation: All assessment and education complete, DC therapy  OT Frequency:     Barriers to D/C:            Co-evaluation              AM-PAC PT "6 Clicks" Daily Activity     Outcome Measure Help from another person eating meals?: None Help from another person taking care of personal grooming?: None Help from another person toileting, which includes using toliet, bedpan, or urinal?: None Help from another person bathing (including washing, rinsing, drying)?: None Help from another person to put on and taking off regular upper body clothing?: None Help from another person to put on and taking off regular lower body clothing?: None 6 Click Score: 24   End of Session Equipment Utilized During Treatment: Other (comment)(SPC) Nurse Communication: Mobility status;Precautions  Activity Tolerance: Patient tolerated treatment well Patient left: in chair;with call bell/phone within reach;with family/visitor present  OT Visit Diagnosis: Other abnormalities of gait and mobility (R26.89);Pain Pain - part of body: (Back)                Time: 0383-3383 OT Time Calculation (min): 22 min Charges:  OT General Charges $OT Visit: 1 Visit OT Evaluation $OT Eval Low Complexity: [place], OTR/L Acute Rehab Pager: [phone] Office: [place] 09/14/2018, 2:28 PM

## 2018-09-14 NOTE — Transfer of Care (Signed)
Immediate Anesthesia Transfer of Care Note  Patient: Jill Branch  Procedure(s) Performed: MICRODISCECTOMY LEFT LUMBAR FIVE- SACRAL ONE  (Left Back)  Patient Location: PACU  Anesthesia Type:General  Level of Consciousness: awake and alert   Airway & Oxygen Therapy: Patient Spontanous Breathing  Post-op Assessment: Report given to RN and Post -op Vital signs reviewed and stable  Post vital signs: Reviewed and stable  Last Vitals:  Vitals Value Taken Time  BP 128/82 09/14/2018 10:29 AM  Temp    Pulse 109 09/14/2018 10:29 AM  Resp 12 09/14/2018 10:29 AM  SpO2 99 % 09/14/2018 10:29 AM  Vitals shown include unvalidated device data.  Last Pain:  Vitals:   09/14/18 1023  TempSrc:   PainSc: Asleep      Patients Stated Pain Goal: 3 (09/14/18 0935)  Complications: No apparent anesthesia complications

## 2018-09-14 NOTE — Progress Notes (Signed)
Patient is discharged from room 3C07 at this time. Alert and in stable condition. IV site d/c'd and instructions read to patient and family with understanding verbalized. Left unit via wheelchair with all belongings at side.  

## 2018-09-14 NOTE — Brief Op Note (Signed)
09/14/2018  9:22 AM  PATIENT:  Courtny M Duplechain  42 y.o. female  PRE-OPERATIVE DIAGNOSIS:  RADICULOPATHY, LUMBAR REGION  POST-OPERATIVE DIAGNOSIS:  RADICULOPATHY, LUMBAR REGION  PROCEDURE:  Procedure(s): MICRODISCECTOMY LEFT LUMBAR FIVE- SACRAL ONE  (Left)  SURGEON:  Surgeon(s) and Role:    * Julio Sicks, MD - Primary  PHYSICIAN ASSISTANT:   ASSISTANTSDoran Durand, NP   ANESTHESIA:   general  EBL:  Minimal    BLOOD ADMINISTERED:none  DRAINS: none   LOCAL MEDICATIONS USED:  MARCAINE     SPECIMEN:  No Specimen  DISPOSITION OF SPECIMEN:  N/A  COUNTS:  YES  TOURNIQUET:  * No tourniquets in log *  DICTATION: .Dragon Dictation  PLAN OF CARE: Admit to inpatient   PATIENT DISPOSITION:  PACU - hemodynamically stable.   Delay start of Pharmacological VTE agent (>24hrs) due to surgical blood loss or risk of bleeding: yes

## 2018-09-15 ENCOUNTER — Encounter (HOSPITAL_COMMUNITY): Payer: Self-pay | Admitting: Neurosurgery

## 2019-08-10 IMAGING — CR DG LUMBAR SPINE 1V
1 series · 1 of 1 positions shown · non-contrast
Comparison: Radiographs 08/19/2018

CLINICAL DATA: Intraoperative localization for lumbar spine
surgery.

EXAM:
LUMBAR SPINE - 1 VIEW

[xtable lateral]
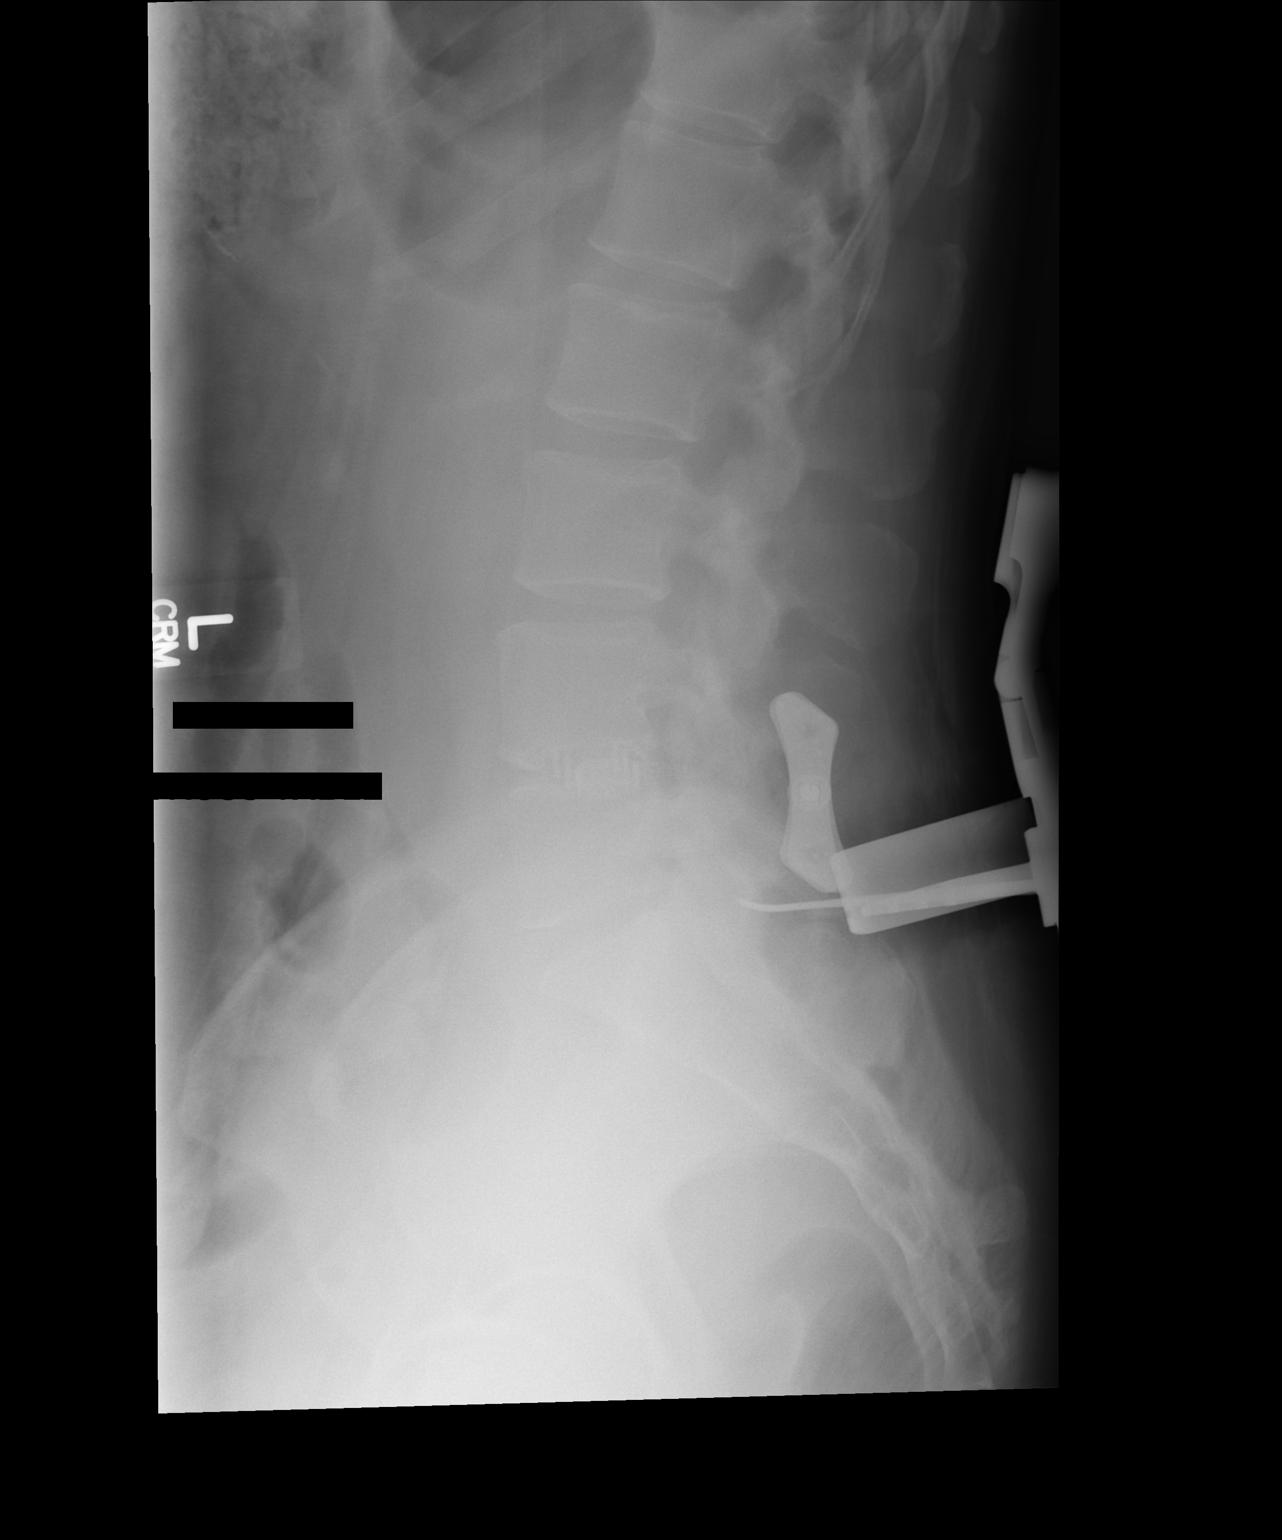

[1 of 1 positions shown; findings below may reference images not displayed]

FINDINGS: Prior interbody and posterior X stop fusion device at L4-5. There
are surgical retractors and a surgical instrument marking the L5-S1
disc space.
IMPRESSION: L5-S1 marked intraoperatively.

## 2022-08-22 ENCOUNTER — Other Ambulatory Visit: Payer: Self-pay | Admitting: Neurosurgery

## 2022-08-22 DIAGNOSIS — M5416 Radiculopathy, lumbar region: Secondary | ICD-10-CM

## 2022-09-08 ENCOUNTER — Ambulatory Visit
Admission: RE | Admit: 2022-09-08 | Discharge: 2022-09-08 | Disposition: A | Discharge status: Home / Self Care | Payer: Medicaid Other | Source: Ambulatory Visit | Attending: Neurosurgery | Admitting: Neurosurgery

## 2022-09-08 DIAGNOSIS — M5416 Radiculopathy, lumbar region: Secondary | ICD-10-CM
